# Patient Record
Sex: Male | Born: 1990
Health system: Southern US, Community
[De-identification: ages and names within clinical notes are randomized; demographics above are authoritative.]

---
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
## 2012-04-30 ENCOUNTER — Encounter (HOSPITAL_COMMUNITY): Payer: Self-pay | Admitting: *Deleted

## 2012-04-30 ENCOUNTER — Emergency Department (HOSPITAL_COMMUNITY)
Admission: EM | Admit: 2012-04-30 | Discharge: 2012-04-30 | Disposition: A | Discharge status: Home / Self Care | Payer: Self-pay | Attending: Emergency Medicine | Admitting: Emergency Medicine

## 2012-04-30 DIAGNOSIS — Y9289 Other specified places as the place of occurrence of the external cause: Secondary | ICD-10-CM | POA: Insufficient documentation

## 2012-04-30 DIAGNOSIS — S0181XA Laceration without foreign body of other part of head, initial encounter: Secondary | ICD-10-CM

## 2012-04-30 DIAGNOSIS — S0180XA Unspecified open wound of other part of head, initial encounter: Secondary | ICD-10-CM | POA: Insufficient documentation

## 2012-04-30 DIAGNOSIS — W1809XA Striking against other object with subsequent fall, initial encounter: Secondary | ICD-10-CM | POA: Insufficient documentation

## 2012-04-30 DIAGNOSIS — F17200 Nicotine dependence, unspecified, uncomplicated: Secondary | ICD-10-CM | POA: Insufficient documentation

## 2012-04-30 DIAGNOSIS — Z23 Encounter for immunization: Secondary | ICD-10-CM | POA: Insufficient documentation

## 2012-04-30 MED ORDER — TETANUS-DIPHTH-ACELL PERTUSSIS 5-2.5-18.5 LF-MCG/0.5 IM SUSP
0.5000 mL | Freq: Once | INTRAMUSCULAR | Status: AC
  Administered 2012-04-30: 0.5 mL via INTRAMUSCULAR
  Filled 2012-04-30: qty 0.5

## 2012-04-30 NOTE — ED Provider Notes (Addendum)
Medical screening examination/treatment/procedure(s) were conducted as a shared visit with non-physician practitioner(s) and myself.  I personally evaluated the patient during the encounter  Eyes laceration of his forehead.  No indication for imaging.  Laceration repaired by [facility]  Lyanne Co, MD 04/30/12 [phone]

## 2012-04-30 NOTE — Discharge Instructions (Signed)
You were seen and evaluated today for your fall and laceration to your forehead.  Your laceration was cleaned and repaired with sutures.  You will need to have your sutures removed in 5-7 days.  If you develop any redness of the skin, increasing pain, bleeding or drainage return sooner to have your wound evaluated.  Facial Laceration A facial laceration is a cut on the face. Lacerations usually heal quickly, but they need special care to reduce scarring. It will take 1 to 2 years for the scar to lose its redness and to heal completely. TREATMENT  Some facial lacerations may not require closure. Some lacerations may not be able to be closed due to an increased risk of infection. It is important to see your caregiver as soon as possible after an injury to minimize the risk of infection and to maximize the opportunity for successful closure. If closure is appropriate, pain medicines may be given, if needed. The wound will be cleaned to help prevent infection. Your caregiver will use stitches (sutures), staples, wound glue (adhesive), or skin adhesive strips to repair the laceration. These tools bring the skin edges together to allow for faster healing and a better cosmetic outcome. However, all wounds will heal with a scar.  Once the wound has healed, scarring can be minimized by covering the wound with sunscreen during the day for 1 full year. Use a sunscreen with an SPF of at least 30. Sunscreen helps to reduce the pigment that will form in the scar. When applying sunscreen to a completely healed wound, massage the scar for a few minutes to help reduce the appearance of the scar. Use circular motions with your fingertips, on and around the scar. Do not massage a healing wound. HOME CARE INSTRUCTIONS For sutures:  Keep the wound clean and dry.   If you were given a bandage (dressing), you should change it at least once a day. Also change the dressing if it becomes wet or dirty, or as directed by your  caregiver.   Wash the wound with soap and water 2 times a day. Rinse the wound off with water to remove all soap. Pat the wound dry with a clean towel.   After cleaning, apply a thin layer of the antibiotic ointment recommended by your caregiver. This will help prevent infection and keep the dressing from sticking.   You may shower as usual after the first 24 hours. Do not soak the wound in water until the sutures are removed.   Only take over-the-counter or prescription medicines for pain, discomfort, or fever as directed by your caregiver.   Get your sutures removed as directed by your caregiver. With facial lacerations, sutures should usually be taken out after 4 to 5 days to avoid stitch marks.   Wait a few days after your sutures are removed before applying makeup.  For skin adhesive strips:  Keep the wound clean and dry.   Do not get the skin adhesive strips wet. You may bathe carefully, using caution to keep the wound dry.   If the wound gets wet, pat it dry with a clean towel.   Skin adhesive strips will fall off on their own. You may trim the strips as the wound heals. Do not remove skin adhesive strips that are still stuck to the wound. They will fall off in time.  For wound adhesive:  You may briefly wet your wound in the shower or bath. Do not soak or scrub the wound. Do not  swim. Avoid periods of heavy perspiration until the skin adhesive has fallen off on its own. After showering or bathing, gently pat the wound dry with a clean towel.   Do not apply liquid medicine, cream medicine, ointment medicine, or makeup to your wound while the skin adhesive is in place. This may loosen the film before your wound is healed.   If a dressing is placed over the wound, be careful not to apply tape directly over the skin adhesive. This may cause the adhesive to be pulled off before the wound is healed.   Avoid prolonged exposure to sunlight or tanning lamps while the skin adhesive is in  place. Exposure to ultraviolet light in the first year will darken the scar.   The skin adhesive will usually remain in place for 5 to 10 days, then naturally fall off the skin. Do not pick at the adhesive film.  You may need a tetanus shot if:  You cannot remember when you had your last tetanus shot.   You have never had a tetanus shot.  If you get a tetanus shot, your arm may swell, get red, and feel warm to the touch. This is common and not a problem. If you need a tetanus shot and you choose not to have one, there is a rare chance of getting tetanus. Sickness from tetanus can be serious. SEEK IMMEDIATE MEDICAL CARE IF:  You develop redness, pain, or swelling around the wound.   There is yellowish-white fluid (pus) coming from the wound.   You develop chills or a fever.  MAKE SURE YOU:  Understand these instructions.   Will watch your condition.   Will get help right away if you are not doing well or get worse.  Document Released: 12/17/2004 Document Revised: 10/29/2011 Document Reviewed: 05/04/2011 [facility] Patient Information 2012 [place], [place].    RESOURCE GUIDE  Chronic Pain Problems: Contact [facility]  [phone] Patients need to be referred by their primary care doctor.  Insufficient Money for Medicine: Contact United Way:  call "211" or [facility] [phone].  No Primary Care Doctor: - Call Health Connect  [phone] - can help you locate a primary care doctor that  accepts your insurance, provides certain services, etc. - Physician Referral [phone]  Agencies that provide inexpensive medical care: - [facility]  [phone] - [facility]  [phone] - [facility]  [phone] [facility]  [phone] - Planned Parenthood  [phone] [facility]  [phone]  Medicaid-accepting [facility] Providers: - [facility]- [address],  [address]  [phone], Mon-Fri 9am-7pm, Sat 9am-1pm - [facility]- [address] [place], [address] [place]  [phone] - [facility]- [address] [place]  [phone] [facility]- [address]  [phone] - Renaye Rakers- [address] [place], [address], [phone]  Only accepts [place] patients after they have their name  applied to their card  Self Pay (no insurance) in [place]: - Sickle Cell Patients: Dr Willey Blade, [facility]  [address] [place], [phone] - [facility]- [address] [place]  [phone]       [facility] [place]- [address]       -     [facility]- see information  above (Speak to [facility] if you do not have insurance)       -  [facility]- [address] [place], [phone]       -  [facility]- [address]  [phone]       -  [facility]- [address] [phone]       -  Dr Julio Sicks-  [address], [address], [place], [phone]       -  [facility]- [address], [phone]       -  [facility]- [address] [phone], also [address] [phone]       -    [facility]- [address] [place], [phone], 1st & 3rd Saturday   every month, 10am-1pm  1) Find a Doctor and Pay Out of Pocket Although you won't have to find out who is covered by your insurance plan, it is a good idea to ask around and get recommendations. You will then need to call the office and see if the doctor you have chosen will accept you as a new patient and what types of options they offer for patients who are self-pay. Some doctors offer discounts or will set up payment plans for their patients who do not have insurance, but you will need to ask so you aren't surprised when you get to your appointment.  2) Contact Your Local Health Department Not all health departments  have doctors that can see patients for sick visits, but many do, so it is worth a call to see if yours does. If you don't know where your local health department is, you can check in your phone book. The CDC also has a tool to help you locate your state's health department, and many state websites also have listings of all of their local health departments.  3) Find a Walk-in Clinic If your illness is not likely to be very severe or complicated, you may want to try a walk in clinic. These are popping up all over the country in pharmacies, drugstores, and shopping centers. They're usually staffed by nurse practitioners or physician assistants that have been trained to treat common illnesses and complaints. They're usually fairly quick and inexpensive. However, if you have serious medical issues or chronic medical problems, these are probably not your best option  STD Testing - [facility] Department of [facility], STD Clinic, [address], [place], phone [phone] or [phone].  Monday - Friday, call for an appointment. [facility] [address] [facility] of [employer], STD Clinic, [address], [place], phone [phone] or [phone].  Monday - Friday, call for an appointment.  Abuse/Neglect: [facility] Child Abuse Hotline 816-354-2328 [facility] Child Abuse Hotline 702-547-2284 (After Hours)  Emergency Shelter:  [facility] (206) [phone]  Maternity Homes: - Room at the [place] [facility] 857-255-9593 - [facility] (787)[phone]  MRSA Hotline #:   (231)323-4573  [facility] Resources  [facility]  United Way [facility] Dept. [address]                 [address]         [address] [place] [address]  [address]  Cristobal Goldmann Phone:  [phone]                                  Phone:   [phone]                   Phone:  [phone]  [facility], [phone] - [facility] - [phone]       -     [facility] in [place], [address]                                  [phone], [facility] Child Abuse Hotline 8310809007 or 541-332-0307 (After Hours)   Behavioral Health Services  Substance Abuse Resources: - [facility]  [phone] - [facility] 726-302-7506 - [facility] [place] [phone] Floydene Flock [phone] - Residential & Outpatient Substance Abuse Program  [phone]  Psychological Services: [facility]  [phone] [facility] Services  [phone] - [facility], [phone] [place]. [address], [place], ACCESS LINE: [phone] or [phone], [email or web address]  Dental Assistance  If unable to pay or uninsured, contact:  [facility] or [facility]. to become qualified for the adult dental clinic.  Patients with Medicaid: [facility] [phone], [phone] [address] [phone]  If unable to pay, or uninsured, contact [facility] [phone]) or [place] Department [phone] in [place], [phone] in [facility]) to become qualified for the adult dental clinic  Other Low-Cost Community Dental Services: - [facility]- [address] [place], [place], [place], [address], [phone], [phone], 2nd and 4th Thursday of the month at 6:30am.  10 clients each day by appointment, can sometimes see walk-in patients if someone does not show for an appointment. [facility]- [address] [place], [place], [address], [phone] - [facility]- [address], [place], [place], [address], [phone] - [facility]- [phone] [facility]  Department- [phone] [facility] Department- [phone]

## 2012-04-30 NOTE — ED Provider Notes (Signed)
History     CSN: [id number]  Arrival date & time 04/30/12  4098   First MD Initiated Contact with Patient 04/30/12 0403      Chief Complaint  Patient presents with  . Facial Laceration   HPI  History provided by the patient. Patient is a 21 year old male with no significant past medical history presents with complaints of laceration and head injury. Patient states that he was outside walking to the store when he slipped on a wet sidewalk causing him to fall. Patient hit head on constrictor. He denies LOC. Patient reports feeling leading to the forehead. Bleeding was controlled. Symptoms are described as mild to moderate. Patient denies any other aggravating or alleviating factors. Patient denies any other injury. He denies any pain in extremities, neck pain or back pain. Patient is unsure as last tetanus shot.    History reviewed. No pertinent past medical history.  History reviewed. No pertinent past surgical history.  No family history on file.  History  Substance Use Topics  . Smoking status: Current Everyday Smoker  . Smokeless tobacco: Not on file  . Alcohol Use: Yes      Review of Systems  HENT: Negative for neck pain.   Eyes: Negative for visual disturbance.  Musculoskeletal: Negative for back pain.  Neurological: Positive for headaches. Negative for dizziness, weakness, light-headedness and numbness.    Allergies  Review of patient's allergies indicates no known allergies.  Home Medications  No current outpatient prescriptions on file.  BP 129/62  Pulse 122  Temp(Src) 98.6 F (37 C) (Oral)  Resp 16  SpO2 97%  Physical Exam  Nursing note and vitals reviewed. Constitutional: He is oriented to person, place, and time. He appears well-developed and well-nourished.  HENT:  Head: Normocephalic.  Right Ear: Tympanic membrane normal.  Left Ear: Tympanic membrane normal.  Mouth/Throat: Oropharynx is clear and moist.       4 cm laceration to forehead above  left eyebrow. No underlying skull depression or deformity.  No battle sign or raccoon eyes.  Eyes: Conjunctivae are normal. Pupils are equal, round, and reactive to light.  Neck: Normal range of motion. Neck supple.       No cervical midline tenderness  Cardiovascular: Normal rate and regular rhythm.   Pulmonary/Chest: Effort normal and breath sounds normal. He has no wheezes. He has no rales.  Neurological: He is alert and oriented to person, place, and time. He has normal strength. No cranial nerve deficit or sensory deficit. Gait normal.  Skin: Skin is warm.  Psychiatric: He has a normal mood and affect. His behavior is normal.    ED Course  Procedures   LACERATION REPAIR Performed by: Angus Seller Authorized by: Angus Seller Consent: Verbal consent obtained. Risks and benefits: risks, benefits and alternatives were discussed Consent given by: patient Patient identity confirmed: provided demographic data Prepped and Draped in normal sterile fashion Wound explored  Laceration Location: forehead  Laceration Length: 4cm  No Foreign Bodies seen or palpated  Anesthesia: local infiltration  Local anesthetic: lidocaine 2% with epinephrine  Anesthetic total: 2 ml  Irrigation method: syringe Amount of cleaning: standard  Skin closure: Skin with 6-0 Nylon  Number of sutures: 9  Technique: simple interrupted.   Patient tolerance: Patient tolerated the procedure well with no immediate complications.    1. Laceration of face       MDM  Patient seen and evaluated. Patient no acute distress.   tetanus was given    Phill Mutter  Kaitlyn Skowron, [place] 04/30/12 [phone]

## 2012-04-30 NOTE — ED Notes (Signed)
Laceration to the middle of his forehead.  He fell and struck his head on concrete.  No loc  Minimal bleeding at present

## 2012-04-30 NOTE — ED Notes (Signed)
Patient is AOx4 and comfortable with his discharge instructions. 

## 2012-04-30 NOTE — ED Notes (Signed)
PA at bedside.

## 2012-05-05 ENCOUNTER — Emergency Department (HOSPITAL_COMMUNITY)
Admission: EM | Admit: 2012-05-05 | Discharge: 2012-05-05 | Disposition: A | Discharge status: Home / Self Care | Payer: Self-pay | Attending: Emergency Medicine | Admitting: Emergency Medicine

## 2012-05-05 ENCOUNTER — Encounter (HOSPITAL_COMMUNITY): Payer: Self-pay | Admitting: *Deleted

## 2012-05-05 DIAGNOSIS — R21 Rash and other nonspecific skin eruption: Secondary | ICD-10-CM | POA: Insufficient documentation

## 2012-05-05 DIAGNOSIS — Z4802 Encounter for removal of sutures: Secondary | ICD-10-CM | POA: Insufficient documentation

## 2012-05-05 DIAGNOSIS — F17200 Nicotine dependence, unspecified, uncomplicated: Secondary | ICD-10-CM | POA: Insufficient documentation

## 2012-05-05 NOTE — Discharge Instructions (Signed)
Read the information below.  Please continue keeping your wound clean and covered with a thin layer of antibiotic ointment.  Return to the ER immediately if you develop redness, swelling, pus draining from the wound, or fevers greater than 100.4.  Please call the dermatologist above or the dermatologist of your choice for a follow up appointment to evaluate and treat your rash.  You may return to the ER at any time for worsening condition or any new symptoms that concern you.  Suture Removal Your caregiver has removed your sutures today. If skin adhesive strips were applied at the time of suturing, or applied following removal of the sutures today, they will begin to peel off in a couple more days. If skin adhesive strips remain after 14 days, they may be removed. HOME CARE INSTRUCTIONS   Change any bandages (dressings) at least once a day or as directed by your caregiver. If the bandage sticks, soak it off with warm, soapy water.   Wash the area with soap and water to remove all the cream or ointment (if you were instructed to use any) 2 times a day. Rinse off the soap and pat the area dry with a clean towel.   Reapply cream or ointment as directed by your caregiver. This will help prevent infection and keep the bandage from sticking.   Keep the wound area dry and clean. If the bandage becomes wet, dirty, or develops a bad smell, change it as soon as possible.   Only take over-the-counter or prescription medicines for pain, discomfort, or fever as directed by your caregiver.   Use sunscreen when out in the sun. New scars become sunburned easily.   Return to your caregivers office in in 7 days or as directed to have your sutures removed.  You may need a tetanus shot if:  You cannot remember when you had your last tetanus shot.   You have never had a tetanus shot.   The injury broke your skin.  If you got a tetanus shot, your arm may swell, get red, and feel warm to the touch. This is common  and not a problem. If you need a tetanus shot and you choose not to have one, there is a rare chance of getting tetanus. Sickness from tetanus can be serious. SEEK IMMEDIATE MEDICAL CARE IF:   There is redness, swelling, or increasing pain in the wound.   Pus is coming from the wound.   An unexplained oral temperature above 102 F (38.9 C) develops.   You notice a bad smell coming from the wound or dressing.   The wound breaks open (edges not staying together) after sutures have been removed.  Document Released: 08/04/2001 Document Revised: 10/29/2011 Document Reviewed: 10/03/2007 [facility] Patient Information 2012 [place], [place].  Rash A rash is a change in the color or texture of your skin. There are many different types of rashes. You may have other problems that accompany your rash. CAUSES   Infections.   Allergic reactions. This can include allergies to pets or foods.   Certain medicines.   Exposure to certain chemicals, soaps, or cosmetics.   Heat.   Exposure to poisonous plants.   Tumors, both cancerous and noncancerous.  SYMPTOMS   Redness.   Scaly skin.   Itchy skin.   Dry or cracked skin.   Bumps.   Blisters.   Pain.  DIAGNOSIS  Your caregiver may do a physical exam to determine what type of rash you have. A skin  sample (biopsy) may be taken and examined under a microscope. TREATMENT  Treatment depends on the type of rash you have. Your caregiver may prescribe certain medicines. For serious conditions, you may need to see a skin doctor (dermatologist). HOME CARE INSTRUCTIONS   Avoid the substance that caused your rash.   Do not scratch your rash. This can cause infection.   You may take cool baths to help stop itching.   Only take over-the-counter or prescription medicines as directed by your caregiver.   Keep all follow-up appointments as directed by your caregiver.  SEEK IMMEDIATE MEDICAL CARE IF:  You have increasing pain, swelling, or  redness.   You have a fever.   You have new or severe symptoms.   You have body aches, diarrhea, or vomiting.   Your rash is not better after 3 days.  MAKE SURE YOU:  Understand these instructions.   Will watch your condition.   Will get help right away if you are not doing well or get worse.  Document Released: 10/30/2002 Document Revised: 10/29/2011 Document Reviewed: 08/24/2011 [facility] Patient Information 2012 [place], [place].

## 2012-05-05 NOTE — ED Notes (Signed)
Pt reports had sutures placed to forehead on fri/sat, is here to have them removed

## 2012-05-05 NOTE — ED Provider Notes (Signed)
History     CSN: [id number]  Arrival date & time 05/05/12  1234   First MD Initiated Contact with Patient 05/05/12 1239      Chief Complaint  Patient presents with  . Suture / Staple Removal    (Consider location/radiation/quality/duration/timing/severity/associated sxs/prior treatment) HPI Comments: Patient was seen in the ED 5 days for laceration repair to the forehead.  States he has been putting antibiotic ointment on it and ice and has not had any problems.  Denies fevers.    Pt also notes that he has had a rash on his neck and upper chest x 2 years that is occasionally pruritic, no associated symptoms.  Notes it gets worse when he showers.  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  Has not been seen for this problem previously.  He and his father think it is a fungal rash.  No treatment tried.  Denies fevers, pain, any associated untreated infections.    Patient is a 21 y.o. male presenting with suture removal. The history is provided by the patient.  Suture / Staple Removal     History reviewed. No pertinent past medical history.  History reviewed. No pertinent past surgical history.  History reviewed. No pertinent family history.  History  Substance Use Topics  . Smoking status: Current Everyday Smoker  . Smokeless tobacco: Not on file  . Alcohol Use: Yes      Review of Systems  Constitutional: Negative for fever and chills.  Cardiovascular: Negative for chest pain.  Skin: Positive for rash and wound.    Allergies  Review of patient's allergies indicates no known allergies.  Home Medications  No current outpatient prescriptions on file.  BP 130/63  Pulse 60  Temp 98.1 F (36.7 C) (Oral)  Resp 16  SpO2 100%  Physical Exam  Nursing note and vitals reviewed. Constitutional: He appears well-developed and well-nourished. No distress.  HENT:  Head: Normocephalic and atraumatic.  Neck: Neck supple.  Pulmonary/Chest: Effort normal.    Neurological: He is alert.  Skin: He is not diaphoretic.       Serpiginous rash over left neck, rough texture, light pink in color.  No central clearance.    Wound over forehead healing well.  No erythema, edema, warmth.        ED Course  Procedures (including critical care time)  Labs Reviewed - No data to display No results found.  SUTURE REMOVAL Performed by: Rise Patience  Consent: Verbal consent obtained. Patient identity confirmed: provided demographic data Time out: Immediately prior to procedure a "time out" was called to verify the correct patient, procedure, equipment, support staff and site/side marked as required.  Location details: [facility]  Wound Appearance: clean  Sutures/Staples Removed: 9  Facility: sutures placed in this facility Patient tolerance: Patient tolerated the procedure well with no immediate complications.     1. Visit for suture removal   2. Rash       MDM  Pt with sutures placed in forehead 5 days ago in ED.  Wound is healing well.  No signs of infection.  Discussed wound care, return precautions.  Pt also with rash x 2 years.  Not infectious appearing.  Pt given dermatology follow up for this.  Patient verbalizes understanding and agrees with plan.          Rise Patience, [place] 05/05/12 1326

## 2012-05-05 NOTE — ED Notes (Signed)
SUTURES REMOVED BY PA WEST UPON HER EXAM OF PT

## 2012-05-05 NOTE — ED Notes (Signed)
Patient states he is here to have stitches removed from his forehead.

## 2012-05-06 NOTE — ED Provider Notes (Signed)
Medical screening examination/treatment/procedure(s) were performed by non-physician practitioner and as supervising physician I was immediately available for consultation/collaboration.   Samanda Buske M Delsa Walder, MD 05/06/12 0639 

## 2013-04-30 ENCOUNTER — Encounter (HOSPITAL_COMMUNITY)
Admission: EM | Disposition: A | Discharge status: Home / Self Care | Payer: Self-pay | Source: Home / Self Care | Attending: Emergency Medicine

## 2013-04-30 ENCOUNTER — Emergency Department (HOSPITAL_COMMUNITY)
Admission: EM | Admit: 2013-04-30 | Discharge: 2013-04-30 | Disposition: A | Discharge status: Home / Self Care | Payer: Self-pay | Attending: Emergency Medicine | Admitting: Emergency Medicine

## 2013-04-30 ENCOUNTER — Emergency Department (HOSPITAL_COMMUNITY): Payer: Self-pay | Admitting: Anesthesiology

## 2013-04-30 ENCOUNTER — Encounter (HOSPITAL_COMMUNITY): Payer: Self-pay | Admitting: Anesthesiology

## 2013-04-30 ENCOUNTER — Encounter (HOSPITAL_COMMUNITY): Payer: Self-pay | Admitting: *Deleted

## 2013-04-30 ENCOUNTER — Emergency Department (HOSPITAL_COMMUNITY): Payer: Self-pay

## 2013-04-30 DIAGNOSIS — F17200 Nicotine dependence, unspecified, uncomplicated: Secondary | ICD-10-CM | POA: Insufficient documentation

## 2013-04-30 DIAGNOSIS — Y9366 Activity, soccer: Secondary | ICD-10-CM | POA: Insufficient documentation

## 2013-04-30 DIAGNOSIS — W19XXXA Unspecified fall, initial encounter: Secondary | ICD-10-CM | POA: Insufficient documentation

## 2013-04-30 DIAGNOSIS — S52599A Other fractures of lower end of unspecified radius, initial encounter for closed fracture: Secondary | ICD-10-CM | POA: Insufficient documentation

## 2013-04-30 DIAGNOSIS — S52501A Unspecified fracture of the lower end of right radius, initial encounter for closed fracture: Secondary | ICD-10-CM

## 2013-04-30 HISTORY — PX: OPEN REDUCTION INTERNAL FIXATION (ORIF) DISTAL RADIAL FRACTURE: SHX5989

## 2013-04-30 SURGERY — OPEN REDUCTION INTERNAL FIXATION (ORIF) DISTAL RADIUS FRACTURE
Anesthesia: General | Site: Arm Lower | Laterality: Right | Wound class: Clean

## 2013-04-30 MED ORDER — MIDAZOLAM HCL 5 MG/5ML IJ SOLN: INTRAMUSCULAR | Status: DC | PRN

## 2013-04-30 MED ORDER — SUCCINYLCHOLINE CHLORIDE 20 MG/ML IJ SOLN
INTRAMUSCULAR | Status: DC | PRN
  Administered 2013-04-30: 120 mg via INTRAVENOUS

## 2013-04-30 MED ORDER — OXYCODONE-ACETAMINOPHEN 5-325 MG PO TABS: 1.0000 | ORAL_TABLET | ORAL | Status: DC | PRN

## 2013-04-30 MED ORDER — MIDAZOLAM HCL 5 MG/5ML IJ SOLN
INTRAMUSCULAR | Status: DC | PRN
  Administered 2013-04-30: 2 mg via INTRAVENOUS

## 2013-04-30 MED ORDER — FENTANYL CITRATE 0.05 MG/ML IJ SOLN
INTRAMUSCULAR | Status: DC | PRN
  Administered 2013-04-30: 100 ug via INTRAVENOUS
  Administered 2013-04-30: 50 ug via INTRAVENOUS
  Administered 2013-04-30: 150 ug via INTRAVENOUS

## 2013-04-30 MED ORDER — OXYCODONE HCL 5 MG PO TABS
ORAL_TABLET | ORAL | Status: AC
  Filled 2013-04-30: qty 1

## 2013-04-30 MED ORDER — OXYCODONE HCL 5 MG PO TABS
5.0000 mg | ORAL_TABLET | Freq: Once | ORAL | Status: AC | PRN
  Administered 2013-04-30: 5 mg via ORAL

## 2013-04-30 MED ORDER — BUPIVACAINE HCL (PF) 0.25 % IJ SOLN
INTRAMUSCULAR | Status: DC | PRN
  Administered 2013-04-30: 10 mL

## 2013-04-30 MED ORDER — ONDANSETRON HCL 4 MG/2ML IJ SOLN
INTRAMUSCULAR | Status: DC | PRN
  Administered 2013-04-30: 4 mg via INTRAVENOUS

## 2013-04-30 MED ORDER — CEFAZOLIN SODIUM-DEXTROSE 2-3 GM-% IV SOLR
INTRAVENOUS | Status: DC | PRN
  Administered 2013-04-30: 2 g via INTRAVENOUS

## 2013-04-30 MED ORDER — PROPOFOL 10 MG/ML IV BOLUS
INTRAVENOUS | Status: DC | PRN
  Administered 2013-04-30: 100 mg via INTRAVENOUS
  Administered 2013-04-30: 300 mg via INTRAVENOUS

## 2013-04-30 MED ORDER — MORPHINE SULFATE 4 MG/ML IJ SOLN
4.0000 mg | Freq: Once | INTRAMUSCULAR | Status: AC
  Administered 2013-04-30: 4 mg via INTRAMUSCULAR
  Filled 2013-04-30: qty 1

## 2013-04-30 MED ORDER — METOCLOPRAMIDE HCL 5 MG/ML IJ SOLN: 10.0000 mg | Freq: Once | INTRAMUSCULAR | Status: DC | PRN

## 2013-04-30 MED ORDER — DEXTROSE 5 % IV SOLN
INTRAVENOUS | Status: DC | PRN
  Administered 2013-04-30: 19:00:00 via INTRAVENOUS

## 2013-04-30 MED ORDER — LIDOCAINE HCL (CARDIAC) 20 MG/ML IV SOLN
INTRAVENOUS | Status: DC | PRN
  Administered 2013-04-30: 100 mg via INTRAVENOUS

## 2013-04-30 MED ORDER — ARTIFICIAL TEARS OP OINT
TOPICAL_OINTMENT | OPHTHALMIC | Status: DC | PRN
  Administered 2013-04-30: 1 via OPHTHALMIC

## 2013-04-30 MED ORDER — BUPIVACAINE HCL (PF) 0.25 % IJ SOLN
INTRAMUSCULAR | Status: AC
  Filled 2013-04-30: qty 30

## 2013-04-30 MED ORDER — DEXAMETHASONE SODIUM PHOSPHATE 4 MG/ML IJ SOLN
INTRAMUSCULAR | Status: DC | PRN
  Administered 2013-04-30: 8 mg via INTRAVENOUS

## 2013-04-30 MED ORDER — HYDROMORPHONE HCL PF 1 MG/ML IJ SOLN
0.2500 mg | INTRAMUSCULAR | Status: DC | PRN
  Administered 2013-04-30 (×2): 0.5 mg via INTRAVENOUS

## 2013-04-30 MED ORDER — LACTATED RINGERS IV SOLN
INTRAVENOUS | Status: DC | PRN
  Administered 2013-04-30 (×2): via INTRAVENOUS

## 2013-04-30 MED ORDER — KETOROLAC TROMETHAMINE 30 MG/ML IJ SOLN
INTRAMUSCULAR | Status: DC | PRN
  Administered 2013-04-30: 30 mg via INTRAVENOUS

## 2013-04-30 MED ORDER — OXYCODONE HCL 5 MG/5ML PO SOLN: 5.0000 mg | Freq: Once | ORAL | Status: AC | PRN

## 2013-04-30 MED ORDER — HYDROMORPHONE HCL PF 1 MG/ML IJ SOLN
INTRAMUSCULAR | Status: AC
  Filled 2013-04-30: qty 1

## 2013-04-30 SURGICAL SUPPLY — 66 items
APL SKNCLS STERI-STRIP NONHPOA (GAUZE/BANDAGES/DRESSINGS) ×1
BANDAGE ELASTIC 3 VELCRO ST LF (GAUZE/BANDAGES/DRESSINGS) ×2 IMPLANT
BANDAGE ELASTIC 4 VELCRO ST LF (GAUZE/BANDAGES/DRESSINGS) ×2 IMPLANT
BANDAGE GAUZE ELAST BULKY 4 IN (GAUZE/BANDAGES/DRESSINGS) ×3 IMPLANT
BENZOIN TINCTURE PRP APPL 2/3 (GAUZE/BANDAGES/DRESSINGS) ×1 IMPLANT
BIT DRILL 2 FAST STEP (BIT) ×1 IMPLANT
BIT DRILL 2.5X4 QC (BIT) ×1 IMPLANT
BNDG CMPR 9X4 STRL LF SNTH (GAUZE/BANDAGES/DRESSINGS) ×1
BNDG ESMARK 4X9 LF (GAUZE/BANDAGES/DRESSINGS) ×2 IMPLANT
CLOTH BEACON ORANGE TIMEOUT ST (SAFETY) ×2 IMPLANT
CLSR STERI-STRIP ANTIMIC 1/2X4 (GAUZE/BANDAGES/DRESSINGS) ×1 IMPLANT
CORDS BIPOLAR (ELECTRODE) ×1 IMPLANT
COVER SURGICAL LIGHT HANDLE (MISCELLANEOUS) ×2 IMPLANT
CUFF TOURNIQUET SINGLE 18IN (TOURNIQUET CUFF) ×2 IMPLANT
CUFF TOURNIQUET SINGLE 24IN (TOURNIQUET CUFF) IMPLANT
DRAPE C-ARM MINI 42X72 WSTRAPS (DRAPES) ×2 IMPLANT
DRAPE OEC MINIVIEW 54X84 (DRAPES) IMPLANT
DRAPE SURG 17X23 STRL (DRAPES) ×2 IMPLANT
DURAPREP 26ML APPLICATOR (WOUND CARE) ×2 IMPLANT
ELECT REM PT RETURN 9FT ADLT (ELECTROSURGICAL)
ELECTRODE REM PT RTRN 9FT ADLT (ELECTROSURGICAL) IMPLANT
GAUZE XEROFORM 1X8 LF (GAUZE/BANDAGES/DRESSINGS) ×2 IMPLANT
GLOVE BIO SURGEON STRL SZ8 (GLOVE) ×1 IMPLANT
GLOVE BIO SURGEON STRL SZ8.5 (GLOVE) ×2 IMPLANT
GLOVE BIOGEL PI IND STRL 8 (GLOVE) IMPLANT
GLOVE BIOGEL PI INDICATOR 8 (GLOVE) ×1
GOWN SRG XL XLNG 56XLVL 4 (GOWN DISPOSABLE) ×1 IMPLANT
GOWN STRL NON-REIN LRG LVL3 (GOWN DISPOSABLE) ×2 IMPLANT
GOWN STRL NON-REIN XL XLG LVL4 (GOWN DISPOSABLE) ×2
KIT BASIN OR (CUSTOM PROCEDURE TRAY) ×2 IMPLANT
KIT ROOM TURNOVER OR (KITS) ×2 IMPLANT
MANIFOLD NEPTUNE II (INSTRUMENTS) ×2 IMPLANT
NDL HYPO 25GX1X1/2 BEV (NEEDLE) IMPLANT
NEEDLE HYPO 25GX1X1/2 BEV (NEEDLE) ×2 IMPLANT
NS IRRIG 1000ML POUR BTL (IV SOLUTION) ×2 IMPLANT
PACK ORTHO EXTREMITY (CUSTOM PROCEDURE TRAY) ×2 IMPLANT
PAD ARMBOARD 7.5X6 YLW CONV (MISCELLANEOUS) ×4 IMPLANT
PAD CAST 3X4 CTTN HI CHSV (CAST SUPPLIES) ×1 IMPLANT
PAD CAST 4YDX4 CTTN HI CHSV (CAST SUPPLIES) ×1 IMPLANT
PADDING CAST COTTON 3X4 STRL (CAST SUPPLIES) ×2
PADDING CAST COTTON 4X4 STRL (CAST SUPPLIES) ×2
PEG SUBCHONDRAL SMOOTH 2.0X22 (Peg) ×2 IMPLANT
PEG SUBCHONDRAL SMOOTH 2.0X24 (Peg) ×4 IMPLANT
PEG SUBCHONDRAL SMOOTH 2.0X26 (Peg) ×3 IMPLANT
PENCIL BUTTON HOLSTER BLD 10FT (ELECTRODE) IMPLANT
PLATE WIDE 28.2X62.6 RT (Plate) ×1 IMPLANT
SCREW CORT 3.5X14 LNG (Screw) ×3 IMPLANT
SPLINT FIBERGLASS 4X15 (CAST SUPPLIES) ×1 IMPLANT
SPONGE GAUZE 4X4 12PLY (GAUZE/BANDAGES/DRESSINGS) ×2 IMPLANT
SPONGE LAP 4X18 X RAY DECT (DISPOSABLE) IMPLANT
SPONGE SCRUB IODOPHOR (GAUZE/BANDAGES/DRESSINGS) ×2 IMPLANT
STRIP CLOSURE SKIN 1/2X4 (GAUZE/BANDAGES/DRESSINGS) ×1 IMPLANT
SUT PROLENE 3 0 PS 2 (SUTURE) IMPLANT
SUT VIC AB 0 CT1 27 (SUTURE) ×2
SUT VIC AB 0 CT1 27XBRD ANBCTR (SUTURE) IMPLANT
SUT VIC AB 1 CT1 27 (SUTURE)
SUT VIC AB 1 CT1 27XBRD ANBCTR (SUTURE) IMPLANT
SUT VIC AB 3-0 FS2 27 (SUTURE) IMPLANT
SUT VICRYL 4-0 PS2 18IN ABS (SUTURE) IMPLANT
SUT VICRYL RAPIDE 4/0 PS 2 (SUTURE) ×1 IMPLANT
SYR CONTROL 10ML LL (SYRINGE) ×1 IMPLANT
TOWEL OR 17X24 6PK STRL BLUE (TOWEL DISPOSABLE) ×2 IMPLANT
TOWEL OR 17X26 10 PK STRL BLUE (TOWEL DISPOSABLE) ×2 IMPLANT
TUBE CONNECTING 12X1/4 (SUCTIONS) IMPLANT
UNDERPAD 30X30 INCONTINENT (UNDERPADS AND DIAPERS) ×2 IMPLANT
WATER STERILE IRR 1000ML POUR (IV SOLUTION) ×2 IMPLANT

## 2013-04-30 NOTE — Op Note (Signed)
See [id number]

## 2013-04-30 NOTE — Anesthesia Procedure Notes (Signed)
Procedure Name: Intubation Date/Time: 04/30/2013 6:46 PM Performed by: Wray Kearns A Pre-anesthesia Checklist: Patient identified, Timeout performed, Emergency Drugs available, Suction available and Patient being monitored Patient Re-evaluated:Patient Re-evaluated prior to inductionOxygen Delivery Method: Circle system utilized Preoxygenation: Pre-oxygenation with 100% oxygen Intubation Type: IV induction and Cricoid Pressure applied Ventilation: Mask ventilation without difficulty Laryngoscope Size: Mac and 4 Grade View: Grade I Tube type: Oral Tube size: 7.5 mm Number of attempts: 1 Airway Equipment and Method: Stylet Placement Confirmation: ETT inserted through vocal cords under direct vision,  breath sounds checked- equal and bilateral and positive ETCO2 Secured at: 22 cm Tube secured with: Tape Dental Injury: Teeth and Oropharynx as per pre-operative assessment

## 2013-04-30 NOTE — Transfer of Care (Signed)
Immediate Anesthesia Transfer of Care Note  Patient: Dennis Hernandez  Procedure(s) Performed: Procedure(s): OPEN REDUCTION INTERNAL FIXATION (ORIF) DISTAL RADIAL FRACTURE (Right)  Patient Location: PACU  Anesthesia Type:General  Level of Consciousness: oriented, sedated, patient cooperative and responds to stimulation  Airway & Oxygen Therapy: Patient Spontanous Breathing and Patient connected to nasal cannula oxygen  Post-op Assessment: Report given to PACU RN, Post -op Vital signs reviewed and stable, Patient moving all extremities and Patient moving all extremities X 4  Post vital signs: Reviewed and stable  Complications: No apparent anesthesia complications

## 2013-04-30 NOTE — ED Notes (Signed)
Patient transported to OR.

## 2013-04-30 NOTE — Consult Note (Signed)
Reason for Consult:right distal radius fracture Referring Physician: Roe Hernandez is an 22 y.o. male.  HPI: s/p fall while playing soccer with displaced right distal radius fracture  History reviewed. No pertinent past medical history.  History reviewed. No pertinent past surgical history.  History reviewed. No pertinent family history.  Social History:  reports that he has been smoking.  He does not have any smokeless tobacco history on file. He reports that  drinks alcohol. His drug history is not on file.  Allergies: No Known Allergies  Medications: Scheduled:  No results found for this or any previous visit (from the past 48 hour(s)).  Dg Wrist Complete Right  04/30/2013   *RADIOLOGY REPORT*  Clinical Data: Wrist injury.  Fall.Distal radius pain.  RIGHT WRIST - COMPLETE 3+ VIEW  Comparison: None.  Findings: There is a comminuted intra-articular fracture of the distal radius with apex radial and volar angulation which is mild. Fracture fragments are mildly displaced.  No displacement is in the old ulnar aspect of the distal radial articular surface which overlapped with the ulna.  There is extension both into the radiocarpal joint space and distal radial ulnar joint.  The scaphoid bone appears intact.  Carpal spacing appears within normal limits. The ulnar styloid avulsion is present which may be acute.  IMPRESSION: Comminuted mildly displaced distal radius fracture with intra- articular extension. Likely acute ulnar styloid avulsion fracture.   Original Report Authenticated By: Andreas Newport, M.D.    Review of Systems  All other systems reviewed and are negative.   Blood pressure 138/70, pulse 102, temperature 97.9 F (36.6 C), temperature source Oral, resp. rate 20, height 6\' 2"  (1.88 m), weight 81.647 kg (180 lb), SpO2 98.00%. Physical Exam  Constitutional: He is oriented to person, place, and time. He appears well-developed and well-nourished.  HENT:  Head:  Normocephalic and atraumatic.  Cardiovascular: Normal rate.   Respiratory: Effort normal.  Musculoskeletal:       Right wrist: He exhibits bony tenderness and deformity.  Displaced right intraarticular distal radius fracture  Neurological: He is alert and oriented to person, place, and time.  Psychiatric: He has a normal mood and affect. His behavior is normal. Judgment and thought content normal.    Assessment/Plan: As above   Plan ORIF today  Sophonie Goforth A 04/30/2013, 5:44 PM

## 2013-04-30 NOTE — ED Provider Notes (Signed)
History    This chart was scribed for Roxy Horseman, non-physician practitioner working with Celene Kras, MD by Leone Payor, ED Scribe. This patient was seen in room TR07C/TR07C and the patient's care was started at 1532.   CSN: [id number]  Arrival date & time 04/30/13  1532   First MD Initiated Contact with Patient 04/30/13 1554      Chief Complaint  Patient presents with  . Wrist Injury     The history is provided by the patient. No language interpreter was used.    HPI Comments: Dennis Hernandez is a 22 y.o. male who presents to the Emergency Department complaining of a new R wrist injury while playing soccer that occurred today. Rates the pain as 8-9/10 currently. He denies being prescribed strong pain medications in the past. He denies elbow pain. Pt is a current everyday smoker and occasional alcohol user.   History reviewed. No pertinent past medical history.  History reviewed. No pertinent past surgical history.  History reviewed. No pertinent family history.  History  Substance Use Topics  . Smoking status: Current Every Day Smoker  . Smokeless tobacco: Not on file  . Alcohol Use: Yes      Review of Systems A complete 10 system review of systems was obtained and all systems are negative except as noted in the HPI and PMH.   Allergies  Review of patient's allergies indicates no known allergies.  Home Medications  No current outpatient prescriptions on file.  BP 138/70  Pulse 102  Temp(Src) 97.9 F (36.6 C) (Oral)  Resp 20  Ht 6\' 2"  (1.88 m)  Wt 180 lb (81.647 kg)  BMI 23.1 kg/m2  SpO2 98%  Physical Exam  Nursing note and vitals reviewed. Constitutional: He is oriented to person, place, and time. He appears well-developed and well-nourished. No distress.  HENT:  Head: Normocephalic and atraumatic.  Eyes: EOM are normal.  Neck: Neck supple. No tracheal deviation present.  Cardiovascular: Normal rate.   Intact distal pulses and brisk capillary  refill.   Pulmonary/Chest: Effort normal. No respiratory distress.  Musculoskeletal: Normal range of motion.  R wrist remarkable to Colles fracture with obvious deformity.   Neurological: He is alert and oriented to person, place, and time.  Sensation intact.   Skin: Skin is warm and dry.  Psychiatric: He has a normal mood and affect. His behavior is normal.    ED Course  Procedures (including critical care time)  DIAGNOSTIC STUDIES: Oxygen Saturation is 98% on room air, normal by my interpretation.    COORDINATION OF CARE: 4:13 PM Discussed treatment plan with pt at bedside and pt agreed to plan.   5:24 PM Talked with Dr. Mina Marble and he is going to come see pt.   Labs Reviewed - No data to display Dg Wrist Complete Right  04/30/2013   *RADIOLOGY REPORT*  Clinical Data: Wrist injury.  Fall.Distal radius pain.  RIGHT WRIST - COMPLETE 3+ VIEW  Comparison: None.  Findings: There is a comminuted intra-articular fracture of the distal radius with apex radial and volar angulation which is mild. Fracture fragments are mildly displaced.  No displacement is in the old ulnar aspect of the distal radial articular surface which overlapped with the ulna.  There is extension both into the radiocarpal joint space and distal radial ulnar joint.  The scaphoid bone appears intact.  Carpal spacing appears within normal limits. The ulnar styloid avulsion is present which may be acute.  IMPRESSION: Comminuted mildly displaced distal  radius fracture with intra- articular extension. Likely acute ulnar styloid avulsion fracture.   Original Report Authenticated By: Andreas Newport, M.D.     1. Distal radius fracture, right, closed, initial encounter       MDM  Patient with distal radius fracture. Will be admitted by Dr. Mina Marble for surgery now. Patient is neurovascularly intact. No sensory or motor deficits. Patient's pain controlled with morphine while in the emergency department.    I personally  performed the services described in this documentation, which was scribed in my presence. The recorded information has been reviewed and is accurate.    Roxy Horseman, PA-C 04/30/13 1753

## 2013-04-30 NOTE — Op Note (Signed)
NAMEBOBBIE, Dennis Hernandez             ACCOUNT NO.:  [id number]  MEDICAL RECORD NO.:  [id number]  LOCATION:  [facility]                         FACILITY:  [facility]  PHYSICIAN:  Artist Pais. Aarvi Stotts, M.D.DATE OF BIRTH:  1990/12/31  DATE OF PROCEDURE:  04/30/2013 DATE OF DISCHARGE:  04/30/2013                              OPERATIVE REPORT   PREOPERATIVE DIAGNOSIS:  Displaced intra-articular fracture, right distal radius.  POSTOPERATIVE DIAGNOSIS:  Displaced intra-articular fracture, right distal radius.  PROCEDURE:  Open reduction and internal fixation of above using a wide DVR plate and screws.  SURGEON:  Artist Pais. Mina Marble, M.D.  ASSISTANT:  None.  ANESTHESIA:  General.  No complications.  No drains.  DESCRIPTION OF PROCEDURE:  The patient was taken to the operating suite. After induction of adequate general anesthesia, right upper extremity was prepped and draped in the sterile fashion.  An Esmarch was used to exsanguinate the limb.  Tourniquet was inflated to 250 mmHg.  At this point in time, an incision was made over the palpable border of the flexor carpi radialis tendon.  Skin was incised sharply 6-7 cm.  The sheath overlying the FCR was retracted and was incised.  The FCR was retracted in the midline, the radial artery to the lateral side.  This interval was developed down to the level of pronator quadratus.  The pronator quadratus was subperiosteally stripped off the distal radius revealing a displaced intra-articular fracture of the distal radius. The brachioradialis was released off the radial styloid fragment to aid in reduction.  Once this was done, flexion, radial deviation, and traction was used to reduce the displaced intra-articular fracture.  A baby Bennett retractors placed into the sigmoid notch area to help reduce the lunate dissect type fragment.  Once this was done, a standard plate was placed over the DVR, and fluoroscopy was used to images.  This was clearly not  wide enough, therefore a Y plate was then placed and fixed to the slotted drill hole.  Intraoperative fluoroscopy was used to determine adequate position.  Once this was done, the remaining cortical screws were placed proximally followed by smooth pegs distally. Intraoperative x-ray showed adequate reduction in AP, lateral, and oblique view.  The wound was then irrigated and loosely closed in layers of 0 Vicryl to reapproximate the pronator quadratus and a 4-0 Vicryl Rapide subcuticular stitch on the skin.  Steri-Strips, 4 x 4's, fluffs, and a volar splint was applied.  The patient tolerated well and went to the recovery room in stable fashion.     Artist Pais Mina Marble, M.D.     MAW/MEDQ  D:  04/30/2013  T:  04/30/2013  Job:  [id number]

## 2013-04-30 NOTE — ED Notes (Signed)
Deformity to right wrist from injuring it while playing soccer.  Radial pulse present

## 2013-04-30 NOTE — Anesthesia Preprocedure Evaluation (Addendum)
Anesthesia Evaluation  Patient identified by MRN, date of birth, ID band Patient awake    Reviewed: Allergy & Precautions, H&P , NPO status , Patient's Chart, lab work & pertinent test results, reviewed documented beta blocker date and time   Airway Mallampati: II TM Distance: >3 FB Neck ROM: full    Dental   Pulmonary Current Smoker,  breath sounds clear to auscultation        Cardiovascular negative cardio ROS  Rhythm:regular     Neuro/Psych negative neurological ROS  negative psych ROS   GI/Hepatic negative GI ROS, Neg liver ROS,   Endo/Other  negative endocrine ROS  Renal/GU negative Renal ROS  negative genitourinary   Musculoskeletal   Abdominal   Peds  Hematology negative hematology ROS (+)   Anesthesia Other Findings See surgeon's H&P   Reproductive/Obstetrics negative OB ROS                           Anesthesia Physical Anesthesia Plan  ASA: II and emergent  Anesthesia Plan: General   Post-op Pain Management:    Induction: Intravenous, Rapid sequence and Cricoid pressure planned  Airway Management Planned: Oral ETT  Additional Equipment:   Intra-op Plan:   Post-operative Plan: Extubation in OR  Informed Consent: I have reviewed the patients History and Physical, chart, labs and discussed the procedure including the risks, benefits and alternatives for the proposed anesthesia with the patient or authorized representative who has indicated his/her understanding and acceptance.   Dental Advisory Given  Plan Discussed with: CRNA and Surgeon  Anesthesia Plan Comments:         Anesthesia Quick Evaluation  

## 2013-04-30 NOTE — Preoperative (Signed)
Beta Blockers   Reason not to administer Beta Blockers:Not Applicable 

## 2013-04-30 NOTE — ED Notes (Signed)
Patient transported to X-ray 

## 2013-05-01 ENCOUNTER — Encounter (HOSPITAL_COMMUNITY): Payer: Self-pay | Admitting: Orthopedic Surgery

## 2013-05-01 NOTE — Anesthesia Postprocedure Evaluation (Signed)
Anesthesia Post Note  Patient: Dennis Hernandez  Procedure(s) Performed: Procedure(s) (LRB): OPEN REDUCTION INTERNAL FIXATION (ORIF) DISTAL RADIAL FRACTURE (Right)  Anesthesia type: General  Patient location: PACU  Post pain: Pain level controlled  Post assessment: Patient's Cardiovascular Status Stable  Last Vitals:  Filed Vitals:   04/30/13 2044  BP: 123/63  Pulse: 85  Temp: 36.4 C  Resp: 18    Post vital signs: Reviewed and stable  Level of consciousness: alert  Complications: No apparent anesthesia complications

## 2013-05-03 NOTE — ED Provider Notes (Signed)
Medical screening examination/treatment/procedure(s) were performed by non-physician practitioner and as supervising physician I was immediately available for consultation/collaboration.    Mya Suell R Reino Lybbert, MD 05/03/13 1114 

## 2013-10-24 IMAGING — CR DG WRIST COMPLETE 3+V*R*
4 series · 4 of 4 positions shown · non-contrast
Comparison: None.

CLINICAL DATA: Wrist injury.  Fall.Distal radius pain.

RIGHT WRIST - COMPLETE 3+ VIEW

[x wrist lat right]
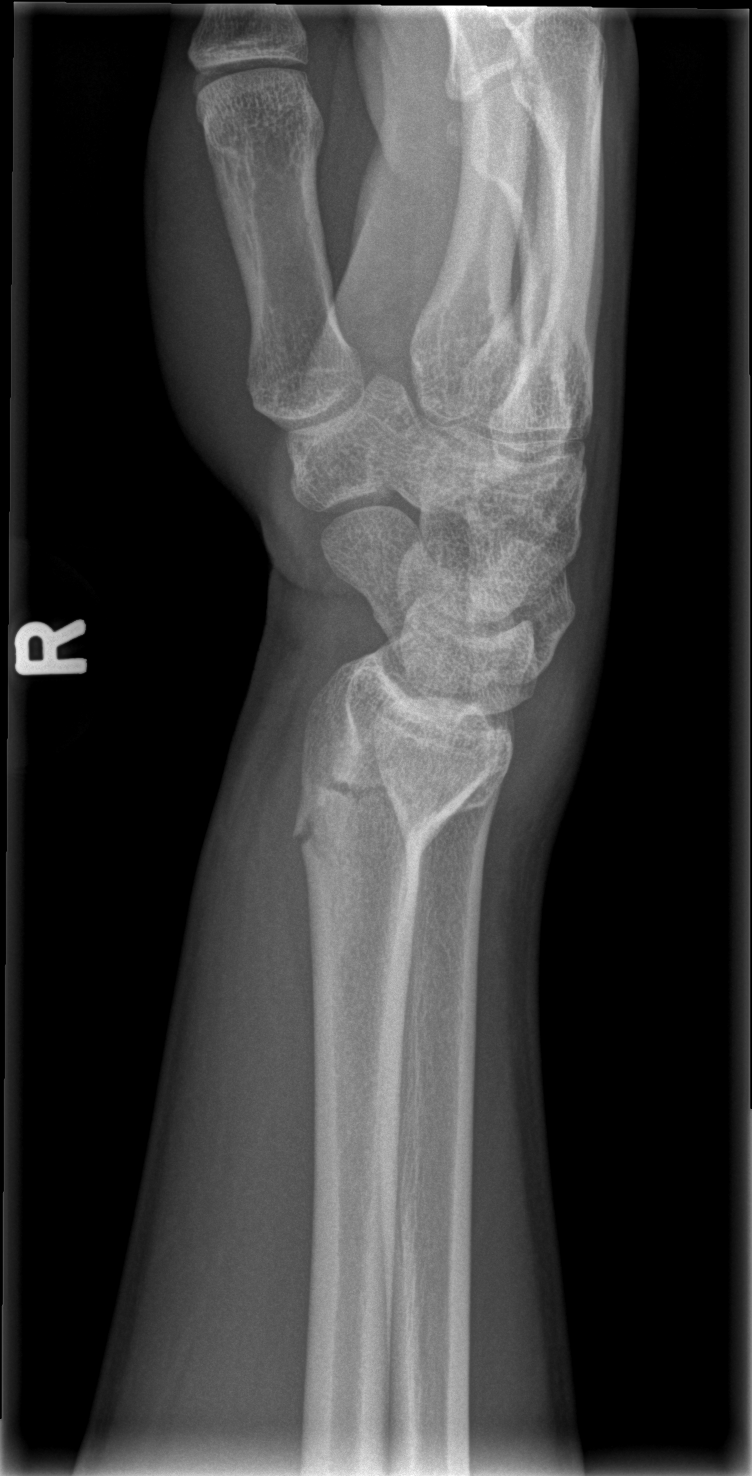

[x wrist obl right]
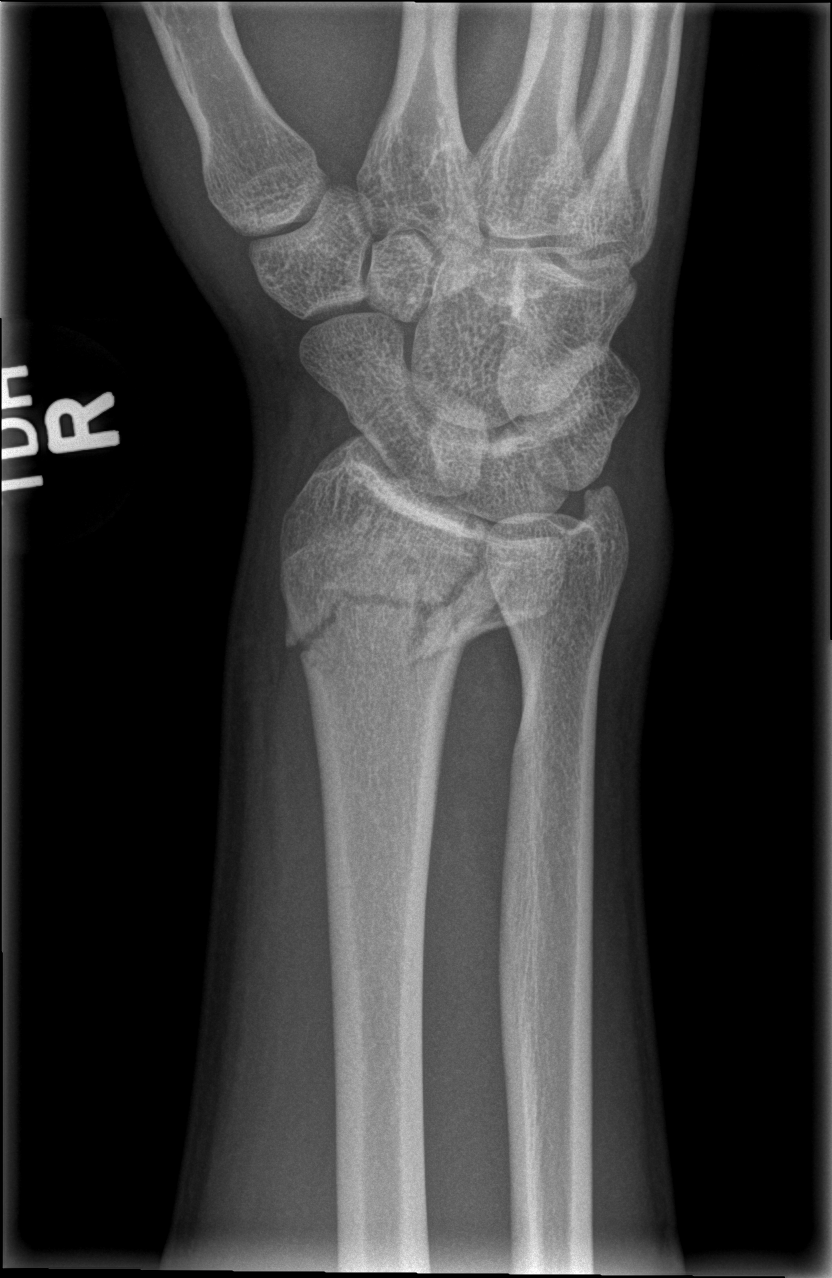

[x wrist pa right]
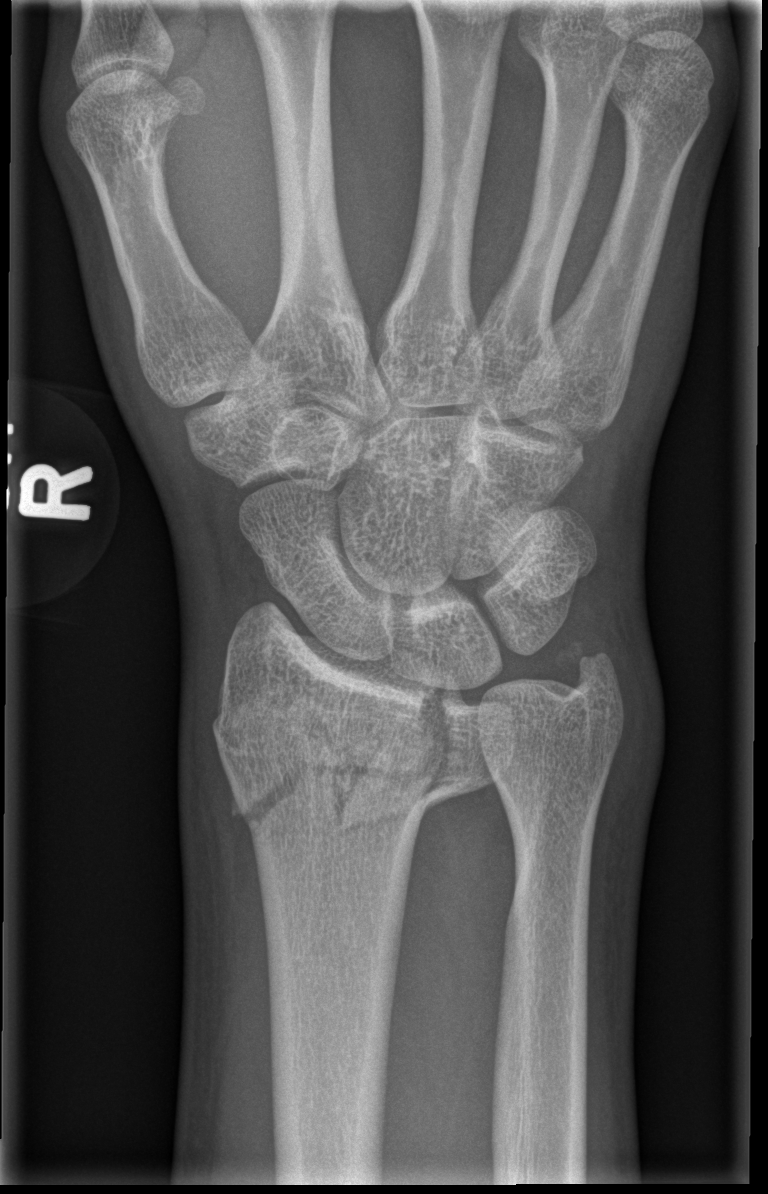

[x wrist navicular view right]
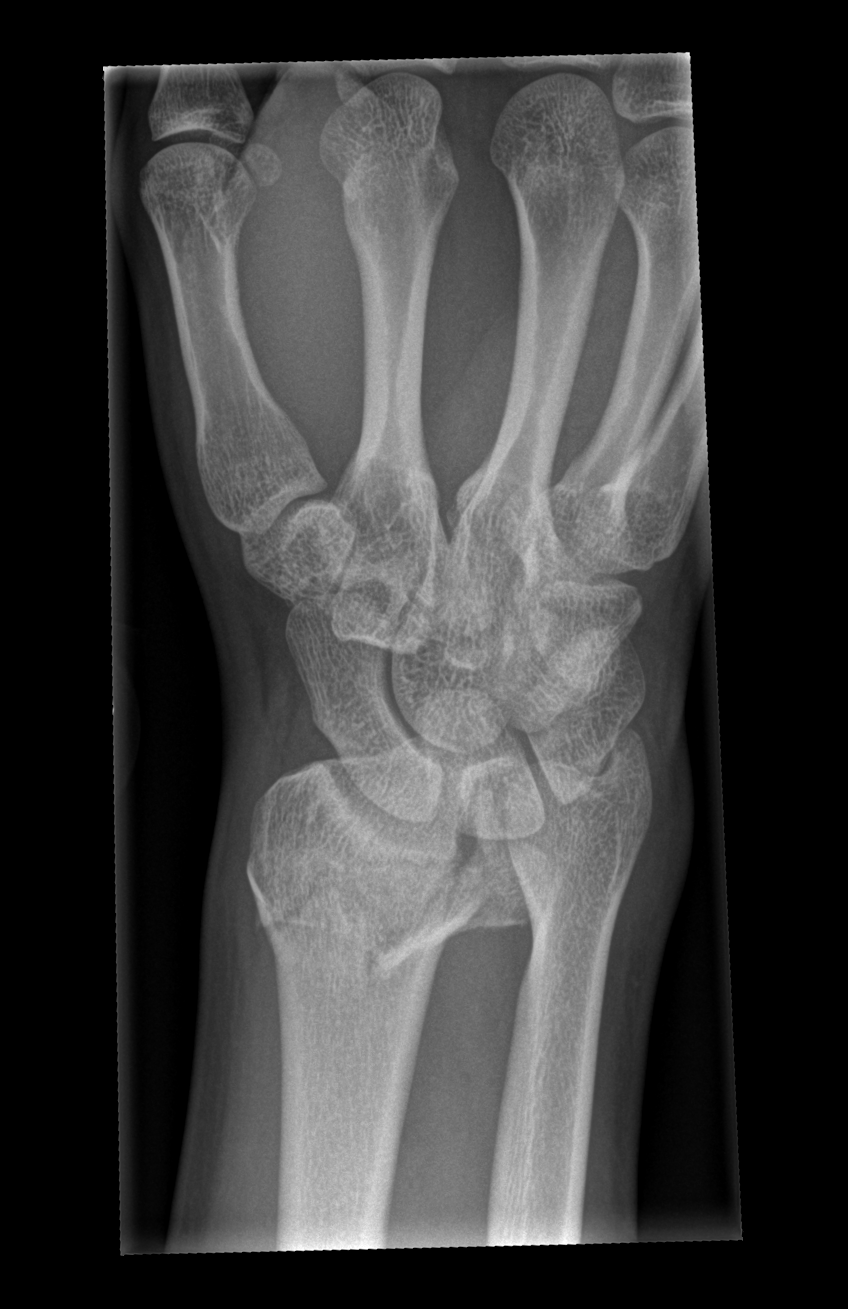

[4 of 4 positions shown; findings below may reference images not displayed]

FINDINGS: There is a comminuted intra-articular fracture of the
distal radius with apex radial and volar angulation which is mild.
Fracture fragments are mildly displaced.  No displacement is in the
old ulnar aspect of the distal radial articular surface which
overlapped with the ulna.  There is extension both into the
radiocarpal joint space and distal radial ulnar joint.  The
scaphoid bone appears intact.  Carpal spacing appears within normal
limits. The ulnar styloid avulsion is present which may be acute.
IMPRESSION: Comminuted mildly displaced distal radius fracture with intra-
articular extension. Likely acute ulnar styloid avulsion fracture.

## 2015-03-14 ENCOUNTER — Emergency Department (HOSPITAL_COMMUNITY)
Admission: EM | Admit: 2015-03-14 | Discharge: 2015-03-14 | Disposition: A | Discharge status: Home / Self Care | Payer: Self-pay | Attending: Emergency Medicine | Admitting: Emergency Medicine

## 2015-03-14 ENCOUNTER — Encounter (HOSPITAL_COMMUNITY): Payer: Self-pay

## 2015-03-14 DIAGNOSIS — Y9289 Other specified places as the place of occurrence of the external cause: Secondary | ICD-10-CM | POA: Insufficient documentation

## 2015-03-14 DIAGNOSIS — S01311A Laceration without foreign body of right ear, initial encounter: Secondary | ICD-10-CM | POA: Insufficient documentation

## 2015-03-14 DIAGNOSIS — W503XXA Accidental bite by another person, initial encounter: Secondary | ICD-10-CM

## 2015-03-14 DIAGNOSIS — Z720 Tobacco use: Secondary | ICD-10-CM | POA: Insufficient documentation

## 2015-03-14 DIAGNOSIS — Z23 Encounter for immunization: Secondary | ICD-10-CM | POA: Insufficient documentation

## 2015-03-14 DIAGNOSIS — S00411A Abrasion of right ear, initial encounter: Secondary | ICD-10-CM

## 2015-03-14 DIAGNOSIS — Y9389 Activity, other specified: Secondary | ICD-10-CM | POA: Insufficient documentation

## 2015-03-14 DIAGNOSIS — Y998 Other external cause status: Secondary | ICD-10-CM | POA: Insufficient documentation

## 2015-03-14 MED ORDER — TETANUS-DIPHTH-ACELL PERTUSSIS 5-2.5-18.5 LF-MCG/0.5 IM SUSP
0.5000 mL | Freq: Once | INTRAMUSCULAR | Status: AC
  Administered 2015-03-14: 0.5 mL via INTRAMUSCULAR
  Filled 2015-03-14: qty 0.5

## 2015-03-14 MED ORDER — LIDOCAINE HCL 1 % IJ SOLN
INTRAMUSCULAR | Status: AC
  Filled 2015-03-14: qty 20

## 2015-03-14 MED ORDER — SODIUM CHLORIDE 0.9 % IV SOLN
3.0000 g | Freq: Once | INTRAVENOUS | Status: AC
  Administered 2015-03-14: 3 g via INTRAVENOUS
  Filled 2015-03-14: qty 3

## 2015-03-14 MED ORDER — AMOXICILLIN-POT CLAVULANATE 875-125 MG PO TABS: 1.0000 | ORAL_TABLET | Freq: Two times a day (BID) | ORAL | Status: AC

## 2015-03-14 MED ORDER — LIDOCAINE HCL 1 % IJ SOLN
INTRAMUSCULAR | Status: AC
  Administered 2015-03-14: 20 mL
  Filled 2015-03-14: qty 20
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
## 2015-03-14 NOTE — ED Notes (Signed)
Ear dressed with xeroform and eye patches, bleeding controlled

## 2018-07-11 MED FILL — AZITHROMYCIN 500 MG TABLET: 500 | 1 days supply | Qty: 2 | Fill #0
